# Patient Record
Sex: Male | Born: 1965 | Race: White | Hispanic: No | Marital: Married | Smoking: Never smoker
Health system: Southern US, Community
[De-identification: ages and names within clinical notes are randomized; demographics above are authoritative.]

## PROBLEM LIST (undated history)

## (undated) DIAGNOSIS — I493 Ventricular premature depolarization: Secondary | ICD-10-CM

## (undated) DIAGNOSIS — E7800 Pure hypercholesterolemia, unspecified: Secondary | ICD-10-CM

## (undated) DIAGNOSIS — J45909 Unspecified asthma, uncomplicated: Secondary | ICD-10-CM

---

## 2014-03-08 ENCOUNTER — Ambulatory Visit: Payer: Self-pay

## 2014-06-05 DIAGNOSIS — M6789 Other specified disorders of synovium and tendon, multiple sites: Secondary | ICD-10-CM | POA: Insufficient documentation

## 2014-06-05 DIAGNOSIS — M20012 Mallet finger of left finger(s): Secondary | ICD-10-CM | POA: Insufficient documentation

## 2016-04-14 ENCOUNTER — Observation Stay
Admission: EM | Admit: 2016-04-14 | Discharge: 2016-04-16 | Disposition: A | Discharge status: Home / Self Care | Payer: TRICARE For Life (TFL) | Attending: Surgery | Admitting: Surgery

## 2016-04-14 ENCOUNTER — Encounter
Admission: EM | Disposition: A | Discharge status: Home / Self Care | Payer: Self-pay | Source: Home / Self Care | Attending: Emergency Medicine

## 2016-04-14 ENCOUNTER — Observation Stay: Payer: TRICARE For Life (TFL) | Admitting: Anesthesiology

## 2016-04-14 ENCOUNTER — Ambulatory Visit (INDEPENDENT_AMBULATORY_CARE_PROVIDER_SITE_OTHER)
Admission: EM | Admit: 2016-04-14 | Discharge: 2016-04-14 | Disposition: A | Discharge status: Hospice | Payer: TRICARE For Life (TFL) | Source: Home / Self Care | Attending: Family Medicine | Admitting: Family Medicine

## 2016-04-14 ENCOUNTER — Encounter: Payer: Self-pay | Admitting: *Deleted

## 2016-04-14 ENCOUNTER — Emergency Department: Payer: TRICARE For Life (TFL)

## 2016-04-14 DIAGNOSIS — Z79899 Other long term (current) drug therapy: Secondary | ICD-10-CM | POA: Insufficient documentation

## 2016-04-14 DIAGNOSIS — R1084 Generalized abdominal pain: Secondary | ICD-10-CM | POA: Diagnosis not present

## 2016-04-14 DIAGNOSIS — Z7951 Long term (current) use of inhaled steroids: Secondary | ICD-10-CM | POA: Insufficient documentation

## 2016-04-14 DIAGNOSIS — K352 Acute appendicitis with generalized peritonitis, without abscess: Secondary | ICD-10-CM

## 2016-04-14 DIAGNOSIS — E039 Hypothyroidism, unspecified: Secondary | ICD-10-CM | POA: Diagnosis not present

## 2016-04-14 DIAGNOSIS — I4891 Unspecified atrial fibrillation: Secondary | ICD-10-CM | POA: Insufficient documentation

## 2016-04-14 DIAGNOSIS — E7800 Pure hypercholesterolemia, unspecified: Secondary | ICD-10-CM | POA: Diagnosis not present

## 2016-04-14 DIAGNOSIS — K5900 Constipation, unspecified: Secondary | ICD-10-CM

## 2016-04-14 DIAGNOSIS — J45909 Unspecified asthma, uncomplicated: Secondary | ICD-10-CM | POA: Insufficient documentation

## 2016-04-14 DIAGNOSIS — K219 Gastro-esophageal reflux disease without esophagitis: Secondary | ICD-10-CM | POA: Insufficient documentation

## 2016-04-14 DIAGNOSIS — Z7982 Long term (current) use of aspirin: Secondary | ICD-10-CM | POA: Diagnosis not present

## 2016-04-14 DIAGNOSIS — K3580 Unspecified acute appendicitis: Secondary | ICD-10-CM | POA: Diagnosis present

## 2016-04-14 HISTORY — DX: Ventricular premature depolarization: I49.3

## 2016-04-14 HISTORY — DX: Unspecified asthma, uncomplicated: J45.909

## 2016-04-14 HISTORY — PX: LAPAROSCOPIC APPENDECTOMY: SHX408

## 2016-04-14 HISTORY — DX: Pure hypercholesterolemia, unspecified: E78.00

## 2016-04-14 LAB — COMPREHENSIVE METABOLIC PANEL
ALT: 32 U/L (ref 17–63)
ANION GAP: 7 (ref 5–15)
AST: 26 U/L (ref 15–41)
Albumin: 3.9 g/dL (ref 3.5–5.0)
Alkaline Phosphatase: 67 U/L (ref 38–126)
BUN: 12 mg/dL (ref 6–20)
CALCIUM: 8.5 mg/dL — AB (ref 8.9–10.3)
CHLORIDE: 103 mmol/L (ref 101–111)
CO2: 24 mmol/L (ref 22–32)
CREATININE: 0.92 mg/dL (ref 0.61–1.24)
Glucose, Bld: 118 mg/dL — ABNORMAL HIGH (ref 65–99)
Potassium: 3.2 mmol/L — ABNORMAL LOW (ref 3.5–5.1)
SODIUM: 134 mmol/L — AB (ref 135–145)
Total Bilirubin: 0.8 mg/dL (ref 0.3–1.2)
Total Protein: 7 g/dL (ref 6.5–8.1)

## 2016-04-14 LAB — LIPASE, BLOOD: LIPASE: 22 U/L (ref 11–51)

## 2016-04-14 LAB — CBC
HCT: 43.7 % (ref 40.0–52.0)
Hemoglobin: 15 g/dL (ref 13.0–18.0)
MCH: 30.3 pg (ref 26.0–34.0)
MCHC: 34.3 g/dL (ref 32.0–36.0)
MCV: 88.4 fL (ref 80.0–100.0)
PLATELETS: 236 10*3/uL (ref 150–440)
RBC: 4.94 MIL/uL (ref 4.40–5.90)
RDW: 13.1 % (ref 11.5–14.5)
WBC: 16.5 10*3/uL — AB (ref 3.8–10.6)

## 2016-04-14 SURGERY — APPENDECTOMY, LAPAROSCOPIC
Anesthesia: General | Site: Abdomen | Wound class: Clean

## 2016-04-14 MED ORDER — LEVOTHYROXINE SODIUM 50 MCG PO TABS
175.0000 ug | ORAL_TABLET | Freq: Every day | ORAL | Status: DC
  Administered 2016-04-15 – 2016-04-16 (×2): 175 ug via ORAL
  Filled 2016-04-14 (×2): qty 1

## 2016-04-14 MED ORDER — PANTOPRAZOLE SODIUM 40 MG PO TBEC
40.0000 mg | DELAYED_RELEASE_TABLET | Freq: Every day | ORAL | Status: DC
  Administered 2016-04-15 – 2016-04-16 (×2): 40 mg via ORAL
  Filled 2016-04-14 (×2): qty 1

## 2016-04-14 MED ORDER — IOPAMIDOL (ISOVUE-300) INJECTION 61%
15.0000 mL | Freq: Once | INTRAVENOUS | Status: AC
  Administered 2016-04-14: 15 mL via ORAL

## 2016-04-14 MED ORDER — ROCURONIUM BROMIDE 100 MG/10ML IV SOLN
INTRAVENOUS | Status: DC | PRN
  Administered 2016-04-14: 30 mg via INTRAVENOUS

## 2016-04-14 MED ORDER — MORPHINE SULFATE (PF) 2 MG/ML IV SOLN
6.0000 mg | Freq: Once | INTRAVENOUS | Status: AC
  Administered 2016-04-14: 4 mg via INTRAVENOUS
  Filled 2016-04-14: qty 3

## 2016-04-14 MED ORDER — ONDANSETRON HCL 4 MG/2ML IJ SOLN: 4.0000 mg | Freq: Four times a day (QID) | INTRAMUSCULAR | Status: DC | PRN

## 2016-04-14 MED ORDER — ACETAMINOPHEN 650 MG RE SUPP: 650.0000 mg | Freq: Four times a day (QID) | RECTAL | Status: DC | PRN

## 2016-04-14 MED ORDER — FENTANYL CITRATE (PF) 100 MCG/2ML IJ SOLN
INTRAMUSCULAR | Status: DC
Start: 2016-04-14 — End: 2016-04-14
  Filled 2016-04-14: qty 2

## 2016-04-14 MED ORDER — PIPERACILLIN-TAZOBACTAM 3.375 G IVPB
3.3750 g | Freq: Three times a day (TID) | INTRAVENOUS | Status: DC
  Administered 2016-04-14 – 2016-04-16 (×6): 3.375 g via INTRAVENOUS
  Filled 2016-04-14 (×6): qty 50

## 2016-04-14 MED ORDER — ENOXAPARIN SODIUM 40 MG/0.4ML ~~LOC~~ SOLN
40.0000 mg | SUBCUTANEOUS | Status: DC
  Administered 2016-04-15: 40 mg via SUBCUTANEOUS
  Filled 2016-04-14: qty 0.4

## 2016-04-14 MED ORDER — PIPERACILLIN-TAZOBACTAM 3.375 G IVPB
3.3750 g | Freq: Once | INTRAVENOUS | Status: AC
Start: 2016-04-14 — End: 2016-04-14
  Administered 2016-04-14: 3.375 g via INTRAVENOUS
  Filled 2016-04-14: qty 50

## 2016-04-14 MED ORDER — KCL IN DEXTROSE-NACL 20-5-0.45 MEQ/L-%-% IV SOLN
INTRAVENOUS | Status: DC
  Administered 2016-04-14 – 2016-04-15 (×2): via INTRAVENOUS
  Administered 2016-04-15: 75 mL/h via INTRAVENOUS
  Administered 2016-04-16: 12:00:00 via INTRAVENOUS
  Filled 2016-04-14 (×6): qty 1000

## 2016-04-14 MED ORDER — FENTANYL CITRATE (PF) 100 MCG/2ML IJ SOLN
INTRAMUSCULAR | Status: DC | PRN
Start: 2016-04-14 — End: 2016-04-14
  Administered 2016-04-14 (×2): 100 ug via INTRAVENOUS

## 2016-04-14 MED ORDER — BUPIVACAINE HCL 0.25 % IJ SOLN
INTRAMUSCULAR | Status: DC | PRN
  Administered 2016-04-14: 30 mL

## 2016-04-14 MED ORDER — PROPOFOL 10 MG/ML IV BOLUS
INTRAVENOUS | Status: DC | PRN
  Administered 2016-04-14: 200 mg via INTRAVENOUS

## 2016-04-14 MED ORDER — SODIUM CHLORIDE 0.9 % IR SOLN
Status: DC | PRN
  Administered 2016-04-14: 1000 mL

## 2016-04-14 MED ORDER — ONDANSETRON HCL 4 MG/2ML IJ SOLN
8.0000 mg | Freq: Once | INTRAMUSCULAR | Status: AC
  Administered 2016-04-14: 8 mg via INTRAVENOUS

## 2016-04-14 MED ORDER — DEXMEDETOMIDINE HCL IN NACL 400 MCG/100ML IV SOLN: INTRAVENOUS | Status: DC | PRN

## 2016-04-14 MED ORDER — DEXMEDETOMIDINE HCL 200 MCG/2ML IV SOLN
INTRAVENOUS | Status: DC | PRN
  Administered 2016-04-14: 8 ug via INTRAVENOUS

## 2016-04-14 MED ORDER — TRAZODONE HCL 100 MG PO TABS
100.0000 mg | ORAL_TABLET | Freq: Every day | ORAL | Status: DC
  Administered 2016-04-14 – 2016-04-15 (×2): 100 mg via ORAL
  Filled 2016-04-14 (×2): qty 1

## 2016-04-14 MED ORDER — ONDANSETRON HCL 4 MG/2ML IJ SOLN: 4.0000 mg | Freq: Once | INTRAMUSCULAR | Status: DC | PRN

## 2016-04-14 MED ORDER — ONDANSETRON HCL 4 MG/2ML IJ SOLN: 8.0000 mg | Freq: Once | INTRAMUSCULAR | Status: DC

## 2016-04-14 MED ORDER — ONDANSETRON HCL 4 MG/2ML IJ SOLN
INTRAMUSCULAR | Status: DC | PRN
Start: 2016-04-14 — End: 2016-04-14
  Administered 2016-04-14: 4 mg via INTRAVENOUS

## 2016-04-14 MED ORDER — SIMVASTATIN 20 MG PO TABS
20.0000 mg | ORAL_TABLET | Freq: Every day | ORAL | Status: DC
Start: 2016-04-14 — End: 2016-04-16
  Administered 2016-04-14 – 2016-04-16 (×3): 20 mg via ORAL
  Filled 2016-04-14 (×3): qty 1

## 2016-04-14 MED ORDER — MORPHINE SULFATE (PF) 2 MG/ML IV SOLN
INTRAVENOUS | Status: AC
  Filled 2016-04-14: qty 2

## 2016-04-14 MED ORDER — SODIUM CHLORIDE 0.9 % IV BOLUS (SEPSIS)
1000.0000 mL | Freq: Once | INTRAVENOUS | Status: AC
  Administered 2016-04-14: 1000 mL via INTRAVENOUS

## 2016-04-14 MED ORDER — PIPERACILLIN-TAZOBACTAM 3.375 G IVPB 30 MIN: 3.3750 g | Freq: Three times a day (TID) | INTRAVENOUS | Status: DC

## 2016-04-14 MED ORDER — ACETAMINOPHEN 325 MG PO TABS: 650.0000 mg | ORAL_TABLET | Freq: Four times a day (QID) | ORAL | Status: DC | PRN

## 2016-04-14 MED ORDER — HEPARIN SODIUM (PORCINE) 5000 UNIT/ML IJ SOLN
INTRAMUSCULAR | Status: DC | PRN
  Administered 2016-04-14: 5000 [IU]

## 2016-04-14 MED ORDER — ONDANSETRON 4 MG PO TBDP: 4.0000 mg | ORAL_TABLET | Freq: Four times a day (QID) | ORAL | Status: DC | PRN

## 2016-04-14 MED ORDER — SUCCINYLCHOLINE CHLORIDE 20 MG/ML IJ SOLN
INTRAMUSCULAR | Status: DC | PRN
  Administered 2016-04-14: 100 mg via INTRAVENOUS

## 2016-04-14 MED ORDER — MORPHINE SULFATE (PF) 2 MG/ML IV SOLN
2.0000 mg | Freq: Once | INTRAVENOUS | Status: AC
  Administered 2016-04-14: 2 mg via INTRAVENOUS
  Filled 2016-04-14: qty 1

## 2016-04-14 MED ORDER — MIDAZOLAM HCL 2 MG/2ML IJ SOLN
INTRAMUSCULAR | Status: DC | PRN
  Administered 2016-04-14: 2 mg via INTRAVENOUS

## 2016-04-14 MED ORDER — LACTATED RINGERS IV SOLN
INTRAVENOUS | Status: DC | PRN
  Administered 2016-04-14: 18:00:00 via INTRAVENOUS

## 2016-04-14 MED ORDER — PANTOPRAZOLE SODIUM 20 MG PO TBEC: 20.0000 mg | DELAYED_RELEASE_TABLET | Freq: Every day | ORAL | Status: DC

## 2016-04-14 MED ORDER — ASPIRIN 81 MG PO CHEW
81.0000 mg | CHEWABLE_TABLET | Freq: Every day | ORAL | Status: DC
  Administered 2016-04-14 – 2016-04-16 (×3): 81 mg via ORAL
  Filled 2016-04-14 (×3): qty 1

## 2016-04-14 MED ORDER — FENTANYL CITRATE (PF) 100 MCG/2ML IJ SOLN: 25.0000 ug | INTRAMUSCULAR | Status: DC | PRN

## 2016-04-14 MED ORDER — PIPERACILLIN-TAZOBACTAM 3.375 G IVPB: 3.3750 g | Freq: Three times a day (TID) | INTRAVENOUS | Status: DC

## 2016-04-14 MED ORDER — SUGAMMADEX SODIUM 200 MG/2ML IV SOLN
INTRAVENOUS | Status: DC | PRN
  Administered 2016-04-14: 200 mg via INTRAVENOUS

## 2016-04-14 MED ORDER — HYDROCODONE-ACETAMINOPHEN 5-325 MG PO TABS
1.0000 | ORAL_TABLET | ORAL | Status: DC | PRN
  Administered 2016-04-15 – 2016-04-16 (×4): 2 via ORAL
  Filled 2016-04-14 (×4): qty 2

## 2016-04-14 MED ORDER — IOPAMIDOL (ISOVUE-300) INJECTION 61%
100.0000 mL | Freq: Once | INTRAVENOUS | Status: AC | PRN
  Administered 2016-04-14: 100 mL via INTRAVENOUS

## 2016-04-14 MED ORDER — ONDANSETRON HCL 4 MG/2ML IJ SOLN
4.0000 mg | Freq: Once | INTRAMUSCULAR | Status: AC
  Administered 2016-04-14: 4 mg via INTRAVENOUS
  Filled 2016-04-14: qty 2

## 2016-04-14 MED ORDER — MORPHINE SULFATE (PF) 4 MG/ML IV SOLN
4.0000 mg | INTRAVENOUS | Status: DC | PRN
  Administered 2016-04-14: 4 mg via INTRAVENOUS

## 2016-04-14 SURGICAL SUPPLY — 34 items
CANISTER SUCT 3000ML (MISCELLANEOUS) ×3 IMPLANT
CHLORAPREP W/TINT 26ML (MISCELLANEOUS) ×3 IMPLANT
CUTTER FLEX LINEAR 45M (STAPLE) ×3 IMPLANT
DRSG TEGADERM 2-3/8X2-3/4 SM (GAUZE/BANDAGES/DRESSINGS) ×9 IMPLANT
DRSG TELFA 3X8 NADH (GAUZE/BANDAGES/DRESSINGS) ×3 IMPLANT
ELECT REM PT RETURN 9FT ADLT (ELECTROSURGICAL) ×3
ELECTRODE REM PT RTRN 9FT ADLT (ELECTROSURGICAL) ×1 IMPLANT
GLOVE BIO SURGEON STRL SZ7.5 (GLOVE) ×3 IMPLANT
GLOVE INDICATOR 8.0 STRL GRN (GLOVE) ×3 IMPLANT
GOWN STRL REUS W/ TWL LRG LVL3 (GOWN DISPOSABLE) ×2 IMPLANT
GOWN STRL REUS W/TWL LRG LVL3 (GOWN DISPOSABLE) ×4
GRASPER SUT TROCAR 14GX15 (MISCELLANEOUS) ×3 IMPLANT
IRRIGATION STRYKERFLOW (MISCELLANEOUS) ×1 IMPLANT
IRRIGATOR STRYKERFLOW (MISCELLANEOUS) ×3
IV NS 1000ML (IV SOLUTION) ×4
IV NS 1000ML BAXH (IV SOLUTION) ×2 IMPLANT
KIT RM TURNOVER STRD PROC AR (KITS) ×3 IMPLANT
NEEDLE FILTER BLUNT 18X 1/2SAF (NEEDLE) ×2
NEEDLE FILTER BLUNT 18X1 1/2 (NEEDLE) ×1 IMPLANT
NEEDLE HYPO 25X1 1.5 SAFETY (NEEDLE) ×3 IMPLANT
NEEDLE INSUFFLATION 14GA 120MM (NEEDLE) ×3 IMPLANT
NS IRRIG 500ML POUR BTL (IV SOLUTION) ×3 IMPLANT
PACK LAP CHOLECYSTECTOMY (MISCELLANEOUS) ×3 IMPLANT
POUCH ENDO CATCH 10MM SPEC (MISCELLANEOUS) ×3 IMPLANT
RELOAD 45 VASCULAR/THIN (ENDOMECHANICALS) ×6 IMPLANT
RELOAD STAPLE TA45 3.5 REG BLU (ENDOMECHANICALS) ×6 IMPLANT
SCISSORS METZENBAUM CVD 33 (INSTRUMENTS) IMPLANT
SUT ETHILON 5-0 FS-2 18 BLK (SUTURE) ×6 IMPLANT
SUT VIC AB 0 CT2 27 (SUTURE) ×3 IMPLANT
SYRINGE 10CC LL (SYRINGE) ×3 IMPLANT
TROCAR XCEL 12X100 BLDLESS (ENDOMECHANICALS) ×6 IMPLANT
TROCAR Z-THREAD FIOS 11X100 BL (TROCAR) ×3 IMPLANT
TROCAR Z-THREAD SLEEVE 11X100 (TROCAR) ×3 IMPLANT
TUBING INSUFFLATOR HI FLOW (MISCELLANEOUS) ×3 IMPLANT

## 2016-04-14 NOTE — ED Triage Notes (Signed)
Patient complains of generalized weakness and abdominal pain that started 2 days ago with sudden worsening today. Patient states that he had a near syncopal episode in our lobby today. Patient states that pain has suddenly gotten very severe and stretches across entire abdomen.

## 2016-04-14 NOTE — Anesthesia Preprocedure Evaluation (Signed)
Anesthesia Evaluation  Patient identified by MRN, date of birth, ID band Patient awake    Reviewed: Allergy & Precautions, NPO status , Patient's Chart, lab work & pertinent test results  Airway Mallampati: II  TM Distance: >3 FB     Dental  (+) Caps, Chipped   Pulmonary asthma ,    Pulmonary exam normal        Cardiovascular Normal cardiovascular exam+ dysrhythmias      Neuro/Psych negative neurological ROS  negative psych ROS   GI/Hepatic Neg liver ROS, GERD  Medicated,Acute appendix   Endo/Other  negative endocrine ROS  Renal/GU negative Renal ROS  negative genitourinary   Musculoskeletal negative musculoskeletal ROS (+)   Abdominal Normal abdominal exam  (+)   Peds negative pediatric ROS (+)  Hematology negative hematology ROS (+)   Anesthesia Other Findings Past Medical History: No date: Asthma No date: High cholesterol No date: PVC (premature ventricular contraction)  Reproductive/Obstetrics                             Anesthesia Physical Anesthesia Plan  ASA: II and emergent  Anesthesia Plan: General   Post-op Pain Management:    Induction: Intravenous, Rapid sequence and Cricoid pressure planned  Airway Management Planned: Oral ETT  Additional Equipment:   Intra-op Plan:   Post-operative Plan: Extubation in OR  Informed Consent: I have reviewed the patients History and Physical, chart, labs and discussed the procedure including the risks, benefits and alternatives for the proposed anesthesia with the patient or authorized representative who has indicated his/her understanding and acceptance.   Dental advisory given  Plan Discussed with: CRNA and Surgeon  Anesthesia Plan Comments:         Anesthesia Quick Evaluation

## 2016-04-14 NOTE — Anesthesia Procedure Notes (Signed)
Procedure Name: Intubation Date/Time: 04/14/2016 6:30 PM Performed by: ZOXWRUEKILDUFF, Eddye Broxterman Pre-anesthesia Checklist: Patient identified, Emergency Drugs available, Suction available, Timeout performed and Patient being monitored Patient Re-evaluated:Patient Re-evaluated prior to inductionOxygen Delivery Method: Circle system utilized Preoxygenation: Pre-oxygenation with 100% oxygen Intubation Type: IV induction and Rapid sequence Ventilation: Mask ventilation throughout procedure Laryngoscope Size: Mac and 3 Grade View: Grade II Tube type: Oral Tube size: 7.5 mm Number of attempts: 1 Airway Equipment and Method: Stylet Placement Confirmation: ETT inserted through vocal cords under direct vision,  positive ETCO2,  CO2 detector and breath sounds checked- equal and bilateral Secured at: 23 cm Tube secured with: Tape

## 2016-04-14 NOTE — ED Notes (Signed)
Dr. Ely at bedside.

## 2016-04-14 NOTE — H&P (Signed)
Florian BuffStephen Wade Lowenstein is a 50 y.o. male  with several days of abdominal pain.  HPI: He was in his usual state of good health until last week when he developed some generalized abdominal discomfort and mild anorexia. He has some nausea and vomiting over the past week and then yesterday had an increase in his abdominal pain. The pain was primarily in the periumbilical area and right lower quadrant. He remains anorexic. His last meal was last evening. Fever or chills. He denies any other similar GI problems. He does have a history of peptic ulcer disease it is quite soft. Otherwise he has no history of hepatitis yellow jaundice pancreatitis gallbladder disease or diverticulitis. He's had no previous abdominal surgery.  He did undergo a cardiac catheterization several years ago for syncopal spell. He has had no cardiac symptoms recently. He does have history of hypercholesterolemia and hypothyroidism. In the emergency room workup demonstrated an elevated white blood cell count and CT scan demonstrated a probable acute appendicitis with some periappendiceal fluid consistent with possible ruptured appendicitis. The surgical service was consulted.  Past Medical History:  Diagnosis Date  . Asthma   . High cholesterol   . PVC (premature ventricular contraction)    History reviewed. No pertinent surgical history. Social History   Social History  . Marital status: Married    Spouse name: N/A  . Number of children: N/A  . Years of education: N/A   Social History Main Topics  . Smoking status: Never Smoker  . Smokeless tobacco: Former NeurosurgeonUser  . Alcohol use No  . Drug use: Unknown  . Sexual activity: Not Asked   Other Topics Concern  . None   Social History Narrative  . None     Review of Systems  Constitutional: Negative for chills and fever.  HENT: Negative.   Eyes: Negative.   Respiratory: Negative for cough, hemoptysis, shortness of breath and wheezing.   Cardiovascular: Negative for  chest pain and palpitations.  Gastrointestinal: Positive for abdominal pain, nausea and vomiting. Negative for constipation, diarrhea and heartburn.  Genitourinary: Negative.   Musculoskeletal: Negative.   Skin: Negative.   Neurological: Negative.   Psychiatric/Behavioral: Negative.      PHYSICAL EXAM: BP 139/89 (BP Location: Right Arm)   Pulse 83   Temp 98.7 F (37.1 C) (Oral)   Resp 18   Ht 6\' 1"  (1.854 m)   Wt 95.3 kg (210 lb)   SpO2 99%   BMI 27.71 kg/m   Physical Exam  Constitutional: He is oriented to person, place, and time. He appears well-developed and well-nourished. No distress.  HENT:  Head: Normocephalic and atraumatic.  Eyes: EOM are normal. Pupils are equal, round, and reactive to light.  Neck: Normal range of motion. Neck supple.  Cardiovascular: Normal rate and regular rhythm.   Pulmonary/Chest: Effort normal and breath sounds normal. No respiratory distress.  Abdominal: Soft. Bowel sounds are normal. He exhibits distension. There is tenderness. There is rebound and guarding.  Musculoskeletal: Normal range of motion. He exhibits no edema or deformity.  Neurological: He is alert and oriented to person, place, and time.  Skin: He is not diaphoretic.  Psychiatric: He has a normal mood and affect. His behavior is normal.   His abdomen is markedly tender right lower quadrant with some guarding and rebound. He does have active bowel sounds.  Impression/Plan: I have independently reviewed his CT scan. He does appear to have a markedly distended appendix with periappendiceal fluid consistent with possible acute appendicitis.  And concern he may have an early rupture or certainly some evidence of necrosis. I discussed surgical intervention with the patient and his wife and we will plan to proceed as soon as an operating room is available. Patient is in agreement.   Tiney Rougealph Ely III, MD  04/14/2016, 2:38 PM

## 2016-04-14 NOTE — ED Triage Notes (Signed)
[facility], states abd pain for 2 days, states vomiting that began this AM, states diarrhea 2 days ago, [facility] had a near syncopal episode in the lobby

## 2016-04-14 NOTE — ED Provider Notes (Signed)
[facility]    CSN: [id number] Arrival date & time: 04/14/16  1042     History   Chief Complaint Chief Complaint  Patient presents with  . Abdominal Pain    HPI Eugene Villegas is a 50 y.o. male.   Patient's here because of abdominal pain. States abdominal pain started about for 5 days ago is progressively gotten worse. He stopped having bowel movements about 2 days ago. The dominant pain became so bad today that he actually passed out as he came into the facility. Patient is unable to give much history with during and pain on the gurney and states that he hurts. [facility]. He has had multiple scars on his arms and multiple injuries. He reports having problem with his cholesterol anxiety. He is in the nurse name and number that they call to get history is he is not able to give much history by his past. No known history of arrhythmias. Unable to ascertain whether he smokes and unable to get much accurate history from the patient.   The history is provided by the patient. No language interpreter was used.  Abdominal Pain  Pain location:  Generalized Pain quality: cramping   Pain severity:  Moderate Onset quality:  Sudden Timing:  Constant Progression:  Worsening Chronicity:  New Relieved by:  Nothing Worsened by:  Nothing Ineffective treatments:  Not moving Associated symptoms: constipation, nausea and vomiting     No past medical history on file.  There are no active problems to display for this patient.   No past surgical history on file.     Home Medications    Prior to Admission medications   Not on File    Family History No family history on file.  Social History Social History  Substance Use Topics  . Smoking status: Not on file  . Smokeless tobacco: Not on file  . Alcohol use Not on file     Allergies   Review of patient's allergies indicates not on file.   Review of Systems Review of  Systems  Unable to perform ROS: Mental status change  Gastrointestinal: Positive for abdominal pain, constipation, nausea and vomiting.  Skin: Negative for color change.  Neurological: Positive for syncope.  Psychiatric/Behavioral: The patient is nervous/anxious.   All other systems reviewed and are negative.    Physical Exam Triage Vital Signs ED Triage Vitals  Enc Vitals Group     BP 04/14/16 1044 (!) 116/59     Pulse Rate 04/14/16 1044 77     Resp 04/14/16 1044 16     Temp 04/14/16 1044 97.7 F (36.5 C)     Temp Source 04/14/16 1044 Oral     SpO2 04/14/16 1044 94 %     Weight 04/14/16 1044 210 lb (95.3 kg)     Height 04/14/16 1044 6\' 1"  (1.854 m)     Head Circumference --      Peak Flow --      Pain Score 04/14/16 1047 10     Pain Loc --      Pain Edu? --      Excl. in GC? --    No data found.   Updated Vital Signs BP (!) 116/59 (BP Location: Left Arm)   Pulse 77   Temp 97.7 F (36.5 C) (Oral)   Resp 16   Ht 6\' 1"  (1.854 m)   Wt 210 lb (95.3 kg)   SpO2 94%  BMI 27.71 kg/m   Visual Acuity Right Eye Distance:   Left Eye Distance:   Bilateral Distance:    Right Eye Near:   Left Eye Near:    Bilateral Near:     Physical Exam  Constitutional: He is oriented to person, place, and time. He appears well-developed and well-nourished.  HENT:  Head: Normocephalic and atraumatic.  Eyes: Pupils are equal, round, and reactive to light.  Neck: Normal range of motion. Neck supple.  Cardiovascular: Normal rate and regular rhythm.   Pulmonary/Chest: Effort normal and breath sounds normal. No respiratory distress. He exhibits no tenderness.  Abdominal: Normal appearance. He exhibits no distension. Bowel sounds are decreased. There is no hepatosplenomegaly. There is generalized tenderness. There is no CVA tenderness. No hernia.  Musculoskeletal: Normal range of motion.  Neurological: He is alert and oriented to person, place, and time. He has normal reflexes.  Skin:  Skin is warm and dry.  Psychiatric: He has a normal mood and affect.  Vitals reviewed.    UC Treatments / Results  Labs (all labs ordered are listed, but only abnormal results are displayed) Labs Reviewed - No data to display  EKG  EKG Interpretation None     EKG showed sinus rhythm with frequent PVCs and there was also question of old septal infarct on EKG. ED ECG REPORT I, Deonta Bomberger H, the attending physician, personally viewed and interpreted this ECG.   Date: 04/14/2016  EKG Time:10:51:31  Rate:83  Rhythm: there are no previous tracings available for comparison, normal sinus rhythm, occasional PVC noted, unifocal  Axis: 26  Intervals:none  ST&T Change: possible septal infarct Radiology No results found.  Procedures Procedures (including critical care time)  Medications Ordered in UC Medications  ondansetron (ZOFRAN) injection 8 mg (8 mg Intravenous Given 04/14/16 1105)     Initial Impression / Assessment and Plan / UC Course  I have reviewed the triage vital signs and the nursing notes.  Pertinent labs & imaging results that were available during my care of the patient were reviewed by me and considered in my medical decision making (see chart for details).  Clinical Course    Should be noted that along with the syncopal/near-syncopal episode he had here he's also had PVCs on the monitor. He was hooked up to 02 which seemed to diminish the PVCs. On EKG he only had a few PVCs noted. He also had what looked like an old septal infarct. Since bowel sounds were still diminished and he was not having a bowel movement for 2 days and having nausea and vomiting now this concerned that he may have a bowel obstruction. IV fluids were started and IV Zofran was given. [facility] and [facility].  Final Clinical Impressions(s) / UC Diagnoses   Final diagnoses:  Generalized abdominal pain  Constipation, unspecified  constipation type    New Prescriptions There are no discharge medications for this patient.    No results found for this or any previous visit.    Note: This dictation was prepared with Dragon dictation along with smaller phrase technology. Any transcriptional errors that result from this process are unintentional.   Eugene RowanEugene Garlen Reinig, MD 04/14/16 [phone]

## 2016-04-14 NOTE — Op Note (Signed)
04/14/2016  7:36 PM  PATIENT:  Florian BuffStephen Wade Dlugosz  50 y.o. male  PRE-OPERATIVE DIAGNOSIS:  appendicitis  POST-OPERATIVE DIAGNOSIS:  appendicitis  PROCEDURE:  Procedure(s): APPENDECTOMY LAPAROSCOPIC (N/A)  SURGEON:  Surgeon(s) and Role:    * Tiney Rougealph Ely III, MD - Primary   ASSISTANTS: none   ANESTHESIA:   general  EBL:  Total I/O In: 800 [I.V.:800] Out: 10 [Blood:10]   DRAINS: none   LOCAL MEDICATIONS USED:  MARCAINE      DISPOSITION OF SPECIMEN:  PATHOLOGY   DICTATION: .Dragon Dictation with the patient in the supine position and after induction of appropriate general anesthesia the patient's abdomen was prepped with ChloraPrep and draped sterile towels. Patient was placed in the headdown feet up position and a small infraumbilical incision was made in the standard fashion. A varies needle was used cannulate peritoneal cavity. CO2 was insufflated to appropriate pressure measurements. When approximately 2-1/2 L of CO2 were instilled a varies needle was withdrawn and an 11 mm port placed without difficulty. Intraperitoneal position was confirmed and CO2 was reinsufflated.  Right lower quadrant was examined. There was some free fluid noted. No obvious purulence was seen. There was some fibrinous exudate over the cecum. A left lower quadrant transverse incision was made 11 mm port inserted under direct vision and a suprapubic incision was made and a 12 mm port inserted under direct vision. The camera was moved to the upper port and dissection carried out through the 2 lower ports. Pinnix was easily visualized markedly thickened but its base appeared to be normal size. A window was created behind the base of the appendix and the appendix was stapled using the Endo GIA stapling device carrying a blue load. Mesoappendix was quite edematous and required 2 loads of the Endo GIA stapling device with a blue stapling load. The specimen was captured Endo Catch apparatus removed through the  umbilical incision. The area was copiously irrigated. The mesoappendix was inspected and no bleeding was encountered. The suprapubic and umbilical incisions were closed fascially with a 0 Vicryl in a figure-of-eight fashion using the suture passer. Abdomen was then desufflated and the skin incision closed with 5-0 nylon. Wounds were dressed sterilely and the patient returned recovery room having tolerated procedure well. 0.25% Marcaine was injected for postoperative pain control. Sponge instrument needle count were correct 2 in the operating room  PLAN OF CARE: Admit for overnight observation  PATIENT DISPOSITION:  PACU - hemodynamically stable.   Tiney Rougealph Ely III, MD

## 2016-04-14 NOTE — Progress Notes (Signed)
Pt is SR with occasional PVCs and occasional ventricular bigeminy with pulse 45.  Informed Dr. Noralyn Pickarroll.  Dr. Noralyn Pickarroll stated pt has hx of PVCs and is ok.  Pt is responsive to voice and AO.  Pt denies pain.  Will continue to monitor.

## 2016-04-14 NOTE — ED Provider Notes (Signed)
[facility] Emergency Department Provider Note  ____________________________________________   First MD Initiated Contact with Patient 04/14/16 1216     (approximate)  I have reviewed the triage vital signs and the nursing notes.  HISTORY  Chief Complaint Abdominal Pain and Near Syncope    HPI Eugene Villegas is a 50 y.o. male here for evaluation of abdominal pain  Patient reports he's been having severe and increasing abdominal pain around the middle of the abdomen that radiates toward his back for about the last 2 days. He has not had a bowel movement 2 days, but the movement he had 2 days ago seemed very normal. He has vomited twice today, and reports she is having severe pain to the point that pain made him feel very lightheaded as though he is given a pass out when he went to urgent care. He denies passing out, and present time reports a 10 out of 10 pain. Midabdominal radiating to the back  No fevers or chills. Denies any spider bites. No alcohol use   Past Medical History:  Diagnosis Date  . Asthma   . High cholesterol   . PVC (premature ventricular contraction)     Patient Active Problem List   Diagnosis Date Noted  . Acute appendicitis with generalized peritonitis     History reviewed. No pertinent surgical history.  Prior to Admission medications   Not on File    Allergies Fish allergy  History reviewed. No pertinent family history.  Social History Social History  Substance Use Topics  . Smoking status: Never Smoker  . Smokeless tobacco: Former NeurosurgeonUser  . Alcohol use No    Review of Systems Constitutional: No fever/chills Eyes: No visual changes. ENT: No sore throat. Cardiovascular: Denies chest pain. Respiratory: Denies shortness of breath. Gastrointestinal: No diarrhea No constipation. Genitourinary: Negative for dysuria. No pain in the groin or testicles. No problems with his penis Musculoskeletal: Negative for  back pain. Skin: Negative for rash. Neurological: Negative for headaches, focal weakness or numbness.  10-point ROS otherwise negative.  ____________________________________________   PHYSICAL EXAM:  VITAL SIGNS: ED Triage Vitals [04/14/16 1137]  Enc Vitals Group     BP 133/82     Pulse Rate 92     Resp 18     Temp 98.7 F (37.1 C)     Temp Source Oral     SpO2 96 %     Weight 210 lb (95.3 kg)     Height 6\' 1"  (1.854 m)     Head Circumference      Peak Flow      Pain Score 9     Pain Loc      Pain Edu?      Excl. in GC?     Constitutional: Alert and oriented. Appears in moderate pain, holding his hands over his mid abdomen Eyes: Conjunctivae are normal. PERRL. EOMI. Head: Atraumatic. Nose: No congestion/rhinnorhea. Mouth/Throat: Mucous membranes are moist.  Oropharynx non-erythematous. Neck: No stridor.   Cardiovascular: Normal rate, regular rhythm. Grossly normal heart sounds.  Good peripheral circulation. Respiratory: Normal respiratory effort.  No retractions. Lungs CTAB. Gastrointestinal: Rather rigid, extremely tender to palpation especially in the epigastrium and periumbilically. The patient has modest peritonitis on exam throughout to percussion. Does not appear distended. The umbilicus is not protuberant, no masses noted. No groin pain or masses are palpable.  Musculoskeletal: No lower extremity tenderness nor edema.  No joint effusions. Neurologic:  Normal speech and language. No gross  focal neurologic deficits are appreciated.  Skin:  Skin is warm, dry and intact. No rash noted. Psychiatric: Mood and affect are normal. Speech and behavior are normal.  ____________________________________________   LABS (all labs ordered are listed, but only abnormal results are displayed)  Labs Reviewed  COMPREHENSIVE METABOLIC PANEL - Abnormal; Notable for the following:       Result Value   Sodium 134 (*)    Potassium 3.2 (*)    Glucose, Bld 118 (*)    Calcium 8.5  (*)    All other components within normal limits  CBC - Abnormal; Notable for the following:    WBC 16.5 (*)    All other components within normal limits  LIPASE, BLOOD  URINALYSIS COMPLETEWITH MICROSCOPIC ([facility] ONLY)   ____________________________________________  EKG  Reviewed and interpreted by me at 1155 Heart rate 85 QRS 90 QTc 460 Normal sinus rhythm, no evidence of ectopy or ischemia ____________________________________________  RADIOLOGY  1240PM: Based on the patient's abdominal examination and concern for peritonitis, requested CT perform a brief oral contrast administration prior to CT but did not wish to delay CT for more than 20-30 minutes as I'm concerned about the possibility of peritonitis, or possible acute illness such as perforation given abdominal exam.  Ct Abdomen Pelvis W Contrast  Result Date: 04/14/2016 CLINICAL DATA:  Weakness, abdominal pain EXAM: CT ABDOMEN AND PELVIS WITH CONTRAST TECHNIQUE: Multidetector CT imaging of the abdomen and pelvis was performed using the standard protocol following bolus administration of intravenous contrast. CONTRAST:  100 mL Isovue 300 IV COMPARISON:  None. FINDINGS: Lower chest: Lung bases are essentially clear. Hepatobiliary: Liver is within normal limits. Gallbladder is unremarkable. No intrahepatic or extrahepatic ductal dilatation. Pancreas: Within normal limits. Spleen: Within normal limits. Adrenals/Urinary Tract: Adrenal glands are within normal limits. 2.7 cm right upper pole renal cyst (series 2/ image 36). 12 mm septated cyst in the interpolar right kidney (series 2/image 38), indeterminate. 7 mm cyst in the anterior left upper kidney (series 2/image 36. No hydronephrosis. Bladder is within normal limits. Stomach/Bowel: Stomach is within normal limits. No evidence of bowel obstruction. Dilatation of the proximal appendix measuring up to 16 mm (series 2/image 70). Mild periappendiceal inflammatory changes (series 2/ image  68). Associated proximal appendicolith (series 2/image 68). These findings are compatible with acute appendicitis. No drainable fluid collection/abscess.  No free air. Vascular/Lymphatic: No evidence of abdominal aortic aneurysm. No suspicious abdominopelvic lymphadenopathy. Reproductive: Prostate is unremarkable. Other: No abdominopelvic ascites. Small fat containing left inguinal hernia (series 2/image 97). Musculoskeletal: Mild degenerative changes of the visualized thoracolumbar spine. IMPRESSION: Acute appendicitis. No drainable fluid collection/ abscess.  No free air. Electronically Signed   By: Charline Bills M.D.   On: 04/14/2016 13:30    ____________________________________________   PROCEDURES  Procedure(s) performed: None  Procedures  Critical Care performed: No  ____________________________________________   INITIAL IMPRESSION / ASSESSMENT AND PLAN / ED COURSE  Pertinent labs & imaging results that were available during my care of the patient were reviewed by me and considered in my medical decision making (see chart for details).  Differential diagnosis includes but is not limited to, abdominal perforation, aortic dissection, cholecystitis, appendicitis, diverticulitis, colitis, esophagitis/gastritis, kidney stone, pyelonephritis, urinary tract infection, aortic aneurysm. All are considered in decision and treatment plan. Based upon the patient's presentation and risk factors, I'm concerned most about abdominal etiology leading to his pain today and near syncopal episode. We'll proceed with CT scan to further delineate.   Clinical Course   -----------------------------------------  1:43 PM on 04/14/2016 -----------------------------------------  Patient reports pain improved. CT scan appears consistent with acute appendicitis. Have paged and placed consult with Dr. Michela PitcherEly  Patient reports his pain is better, currently resting comfortably. Awaiting consult by general  surgery.  ____________________________________________   FINAL CLINICAL IMPRESSION(S) / ED DIAGNOSES  Final diagnoses:  Acute appendicitis with generalized peritonitis      NEW MEDICATIONS STARTED DURING THIS VISIT:  New Prescriptions   No medications on file     Note:  This document was prepared using Dragon voice recognition software and may include unintentional dictation errors.     Sharyn CreamerMark Manfred Laspina, MD 04/14/16 [phone]

## 2016-04-14 NOTE — ED Notes (Signed)
[facility] gave 8mg  IV zofran before arrival

## 2016-04-14 NOTE — Progress Notes (Signed)
Patient still in surgery at this time, left IS on bedside table to set up once patient arrives to room.

## 2016-04-14 NOTE — ED Notes (Signed)
Report given to brandy in the OR °

## 2016-04-14 NOTE — Anesthesia Postprocedure Evaluation (Signed)
Anesthesia Post Note  Patient: Eugene Villegas  Procedure(s) Performed: Procedure(s) (LRB): APPENDECTOMY LAPAROSCOPIC (N/A)  Patient location during evaluation: PACU Anesthesia Type: General Level of consciousness: awake and alert and oriented Pain management: pain level controlled Vital Signs Assessment: post-procedure vital signs reviewed and stable Respiratory status: spontaneous breathing Cardiovascular status: blood pressure returned to baseline Anesthetic complications: no    Last Vitals:  Vitals:   04/14/16 2120 04/14/16 2216  BP: (!) 109/52 140/73  Pulse: 84 97  Resp: 20 18  Temp: 36.8 C 37.1 C    Last Pain:  Vitals:   04/14/16 2216  TempSrc: Oral  PainSc:                  [name]

## 2016-04-14 NOTE — ED Notes (Signed)
Pt transported to [facility] ED via [facility]. 

## 2016-04-14 NOTE — Transfer of Care (Signed)
Immediate Anesthesia Transfer of Care Note  Patient: Eugene Villegas  Procedure(s) Performed: Procedure(s): APPENDECTOMY LAPAROSCOPIC (N/A)  Patient Location: PACU  Anesthesia Type:General  Level of Consciousness: awake and alert   Airway & Oxygen Therapy: Patient Spontanous Breathing  Post-op Assessment: Report given to RN  Post vital signs: Reviewed and stable  Last Vitals:  Vitals:   04/14/16 1600 04/14/16 1925  BP: 130/73 (!) 113/100  Pulse:  100  Resp: 18 20  Temp: 36.8 C 37 C    Last Pain:  Vitals:   04/14/16 1708  TempSrc:   PainSc: 8          Complications: No apparent anesthesia complications

## 2016-04-14 NOTE — ED Notes (Signed)
Pt returned from CT, assisted to bathroom by girlfriend

## 2016-04-14 NOTE — ED Notes (Signed)
Patient transported to CT 

## 2016-04-15 ENCOUNTER — Encounter: Payer: Self-pay | Admitting: Surgery

## 2016-04-15 MED ORDER — MORPHINE SULFATE 2 MG/ML IJ SOLN
4.0000 mg | INTRAMUSCULAR | Status: DC | PRN
  Administered 2016-04-15 (×3): 4 mg via INTRAVENOUS
  Filled 2016-04-15 (×3): qty 2

## 2016-04-15 NOTE — Progress Notes (Signed)
Subjective:   He has moderate abdominal pain with some mild nausea but his A. fib yesterday is much improved. Tolerating his pain medicine. He's not had any fever. He is able to take liquid diet. Overall is markedly improved.  Vital signs in last 24 hours: Temp:  [97 F (36.1 C)-99 F (37.2 C)] 98.3 F (36.8 C) (11/01 1223) Pulse Rate:  [39-105] 86 (11/01 1223) Resp:  [15-26] 18 (11/01 1223) BP: (109-143)/(52-101) 132/72 (11/01 1223) SpO2:  [93 %-99 %] 98 % (11/01 1223) Last BM Date: 04/12/16  Intake/Output from previous day: 10/31 0701 - 11/01 0700 In: 2042 [P.O.:480; I.V.:1537; IV Piggyback:25] Out: 635 [Urine:625; Blood:10]  Exam:  His abdomen is unremarkable with no significant drainage in his wounds look good.  Lab Results:  CBC  Recent Labs  04/14/16 1148  WBC 16.5*  HGB 15.0  HCT 43.7  PLT 236   CMP     Component Value Date/Time   NA 134 (L) 04/14/2016 1148   K 3.2 (L) 04/14/2016 1148   CL 103 04/14/2016 1148   CO2 24 04/14/2016 1148   GLUCOSE 118 (H) 04/14/2016 1148   BUN 12 04/14/2016 1148   CREATININE 0.92 04/14/2016 1148   CALCIUM 8.5 (L) 04/14/2016 1148   PROT 7.0 04/14/2016 1148   ALBUMIN 3.9 04/14/2016 1148   AST 26 04/14/2016 1148   ALT 32 04/14/2016 1148   ALKPHOS 67 04/14/2016 1148   BILITOT 0.8 04/14/2016 1148   GFRNONAA >60 04/14/2016 1148   GFRAA >60 04/14/2016 1148   PT/INR No results for input(s): LABPROT, INR in the last 72 hours.  Studies/Results: Ct Abdomen Pelvis W Contrast  Result Date: 04/14/2016 CLINICAL DATA:  Weakness, abdominal pain EXAM: CT ABDOMEN AND PELVIS WITH CONTRAST TECHNIQUE: Multidetector CT imaging of the abdomen and pelvis was performed using the standard protocol following bolus administration of intravenous contrast. CONTRAST:  100 mL Isovue 300 IV COMPARISON:  None. FINDINGS: Lower chest: Lung bases are essentially clear. Hepatobiliary: Liver is within normal limits. Gallbladder is unremarkable. No intrahepatic  or extrahepatic ductal dilatation. Pancreas: Within normal limits. Spleen: Within normal limits. Adrenals/Urinary Tract: Adrenal glands are within normal limits. 2.7 cm right upper pole renal cyst (series 2/ image 36). 12 mm septated cyst in the interpolar right kidney (series 2/image 38), indeterminate. 7 mm cyst in the anterior left upper kidney (series 2/image 36. No hydronephrosis. Bladder is within normal limits. Stomach/Bowel: Stomach is within normal limits. No evidence of bowel obstruction. Dilatation of the proximal appendix measuring up to 16 mm (series 2/image 70). Mild periappendiceal inflammatory changes (series 2/ image 68). Associated proximal appendicolith (series 2/image 68). These findings are compatible with acute appendicitis. No drainable fluid collection/abscess.  No free air. Vascular/Lymphatic: No evidence of abdominal aortic aneurysm. No suspicious abdominopelvic lymphadenopathy. Reproductive: Prostate is unremarkable. Other: No abdominopelvic ascites. Small fat containing left inguinal hernia (series 2/image 97). Musculoskeletal: Mild degenerative changes of the visualized thoracolumbar spine. IMPRESSION: Acute appendicitis. No drainable fluid collection/ abscess.  No free air. Electronically Signed   By: Charline BillsSriyesh  Krishnan M.D.   On: 04/14/2016 13:30    Assessment/Plan: He seems to be recovering nicely. We'll advance his diet and his activity level and hopefully plan discharge tomorrow.

## 2016-04-15 NOTE — Plan of Care (Signed)
Problem: Education: Goal: Knowledge of [facility] Education information/materials will improve Outcome: Progressing Oriented patient to room and 2c

## 2016-04-16 LAB — SURGICAL PATHOLOGY

## 2016-04-16 MED ORDER — HYDROCODONE-ACETAMINOPHEN 5-325 MG PO TABS: 1.0000 | ORAL_TABLET | ORAL | 0 refills | Status: AC | PRN

## 2016-04-16 NOTE — Discharge Instructions (Signed)
Laparoscopic Appendectomy, Adult °Appendectomy is surgery to remove the appendix. Laparoscopic surgery uses several small cuts (incisions) instead of one large incision. Laparoscopic surgery offers a shorter recovery time and less discomfort. °LET YOUR CAREGIVER KNOW ABOUT: °· Allergies to food or medicine. °· Medicines taken, including vitamins, dietary supplements, herbs, eyedrops, over-the-counter medicines, and creams. °· Use of steroids (by mouth or creams). °· Previous problems with anesthetics or numbing medicines. °· History of bleeding problems or blood clots. °· Previous surgery. °· Other health problems, including diabetes, heart problems, lung problems, and kidney problems. °· Possibility of pregnancy, if this applies. °RISKS AND COMPLICATIONS °· Infection. A germ starts growing in the wound. This can usually be treated with antibiotics. In some cases, the wound will need to be opened and cleaned. °· Bleeding. °· Damage to other organs. °· Sores (abscesses). °· Chronic pain at the incision sites. This is defined as pain that lasts for more than 3 months. °· Blood clots in the legs that may rarely travel to the lungs. °· Infection in the lungs (pneumonia). °BEFORE THE PROCEDURE °Appendectomy is usually performed immediately after an inflamed appendix (appendicitis) is diagnosed. No preparation is necessary ahead of this procedure. °PROCEDURE  °You will be given medicine that makes you sleep (general anesthetic). After you are asleep, a flexible tube (catheter) may be inserted into your bladder to drain your urine during surgery. The tube is removed before you wake up after surgery. When you are asleep, carbon dioxide gas will be used to inflate your abdomen. This will allow your surgeon to see inside your abdomen and perform your surgery. Three small incisions will be made in your abdomen. Your surgeon will insert a thin, lighted tube (laparoscope) through one of the incisions. Your surgeon will look  through the laparoscope while performing the surgery. Other tools will be inserted through the other incisions. Laparoscopic procedures may not be appropriate when: °· There is major scarring from a previous surgery. °· The patient has bleeding disorders. °· A pregnancy is near term. °· There are other conditions which make the laparoscopic procedure impossible, such as an advanced infection or a ruptured appendix. °If your surgeon feels it is not safe to continue with the laparoscopic procedure, he or she will perform an open surgery instead. This gives the surgeon a larger view and more space to work. Open surgery requires a longer recovery time. After your appendix is removed, your incisions will be closed with stitches (sutures) or skin adhesive. °AFTER THE PROCEDURE °You will be taken to a recovery room. When the anesthesia has worn off, you will be returned to your hospital room. You will be given pain medicines to keep you comfortable. Ask your caregiver how long your hospital stay will be. °  °This information is not intended to replace advice given to you by your health care provider. Make sure you discuss any questions you have with your health care provider. °  °Document Released: 01/14/2004 Document Revised: 06/22/2014 Document Reviewed: 11/19/2014 °Elsevier Interactive Patient Education ©2016 [employer] °Laparoscopic Appendectomy  °Care After  ° ° °These instructions give you information on caring for yourself after your procedure. Your doctor may also give you more specific instructions. Call your doctor if you have any problems or questions after your procedure.  °HOME CARE  °Do not drive while taking pain medicine (narcotics).  °Take medicine (stool softener) if you cannot poop (constipated).  °Change your bandages (dressings) as told by your doctor.  °Keep your wounds clean and dry.   Wash the wounds gently with soap and water. Gently pat the wounds dry with a clean towel.  Do not take baths,  swim, or use hot tubs for 10 days, or as told by your doctor.  Only take medicine as told by your doctor.  Continue your normal diet as told by your doctor.  Do not lift more than 10 pounds (4.5 kilograms) or play contact sports for 3 weeks, or as told by your doctor.  Slowly increase your activity.  Take deep breaths to avoid a lung infection (pneumonia). GET HELP RIGHT AWAY IF:  You have a fever >101 You have a rash.  You have trouble breathing or sharp chest pain.  You have a reaction to the medicine you are taking.  Your wound is red, puffy (swollen), or painful.  You have yellowish-white fluid (pus) coming from the wound.  You have fluid coming from the wound for longer than 1 day.  You notice a bad smell coming from the wound or bandage.  Your wound breaks open after stitches (sutures) or staples are removed.  You have pain in the shoulders or shoulder blades.   You are short of breath.  You feel sick to your stomach (nauseous) or throw up (vomit).  MAKE SURE YOU:  Understand these instructions.  Will watch your condition.  Will get help right away if you are not doing well or get worse.

## 2016-04-16 NOTE — Plan of Care (Signed)
Problem: Bowel/Gastric: Goal: Will not experience complications related to bowel motility Outcome: Progressing Education on the importance of ambulation was stressed to the patient, he agreed to walk around the unit later on in the day

## 2016-04-16 NOTE — Progress Notes (Signed)
Reviewed discharge instructions with patient, reviewing signs and symptoms to look out for infection  and numbers to call if needed.  Reviewed discharge medications with the patient and gave him his discharge instruction packet.  Patient was in stable condition and discharged home with gilfriend

## 2016-04-20 ENCOUNTER — Other Ambulatory Visit: Payer: Self-pay

## 2016-04-23 ENCOUNTER — Ambulatory Visit (INDEPENDENT_AMBULATORY_CARE_PROVIDER_SITE_OTHER): Payer: TRICARE For Life (TFL) | Admitting: Surgery

## 2016-04-23 ENCOUNTER — Encounter: Payer: Self-pay | Admitting: Surgery

## 2016-04-23 VITALS — BP 166/82 | HR 77 | Temp 98.5°F | Ht 73.0 in | Wt 209.2 lb

## 2016-04-23 DIAGNOSIS — Z09 Encounter for follow-up examination after completed treatment for conditions other than malignant neoplasm: Secondary | ICD-10-CM

## 2016-04-23 NOTE — Progress Notes (Signed)
S/p lap appy by Dr. Michela PitcherEly Doing well No complaints, taking PO having Bm, no fevers  PE NAD Abd: soft, NT, stitches removed, wounds healing well , no infection  A/P Doing well Path d/w pt No heavy lifting for 6 weeks from time of surgery RTC prn

## 2016-05-02 NOTE — Discharge Summary (Signed)
Patient ID: Florian BuffStephen Wade Carr MRN: [id number] DOB/AGE: Nov 06, 1965 50 y.o.  Admit date: 04/14/2016 Discharge date: 05/02/2016  Discharge Diagnoses:  Acute appendicitis  Procedures Performed: Laparoscopic appendectomy  Discharged Condition: good  Hospital Course: He was admitted with signs and symptoms consistent with acute appendicitis. Clinical presentation and laboratory information and imaging studies confirm the diagnosis. He was taken urgently to surgery where he underwent laparoscopic appendectomy. The procedure was uncomplicated. He had no significant intraoperative or initial postoperative problems. He was discharged home on a regular diet. Bathing activity in drive instructions were given the patient.  Discharge Orders: Discharge Instructions    Diet - low sodium heart healthy    Complete by:  As directed    Driving Restrictions    Complete by:  As directed    You may drive when no longer taking pain medicine   Increase activity slowly    Complete by:  As directed    Lifting restrictions    Complete by:  As directed    Do not lift anything heavier than your dinner plate   No dressing needed    Complete by:  As directed       Disposition: 01-Home or Self Care  Discharge Medications: No current facility-administered medications for this encounter.   Current Outpatient Prescriptions:  .  aspirin 81 MG chewable tablet, Chew 1 tablet by mouth daily., Disp: , Rfl:  .  levothyroxine (SYNTHROID, LEVOTHROID) 175 MCG tablet, Take 1 tablet by mouth daily., Disp: , Rfl:  .  pantoprazole (PROTONIX) 20 MG tablet, Take 20 mg by mouth daily., Disp: , Rfl:  .  simvastatin (ZOCOR) 20 MG tablet, Take 1 tablet by mouth daily., Disp: , Rfl:  .  traZODone (DESYREL) 100 MG tablet, Take 1 tablet by mouth at bedtime. , Disp: , Rfl:  .  albuterol (PROVENTIL HFA;VENTOLIN HFA) 108 (90 Base) MCG/ACT inhaler, Inhale 2 puffs into the lungs as needed., Disp: , Rfl:  .  cetirizine (ZYRTEC)  10 MG tablet, Take 1 tablet by mouth 1 day or 1 dose., Disp: , Rfl:  .  divalproex (DEPAKOTE) 250 MG DR tablet, Take 1 tablet by mouth 1 day or 1 dose., Disp: , Rfl:  .  fluticasone (FLONASE) 50 MCG/ACT nasal spray, Place 2 sprays into the nose 1 day or 1 dose., Disp: , Rfl:  .  HYDROcodone-acetaminophen (NORCO/VICODIN) 5-325 MG tablet, Take 1-2 tablets by mouth every 4 (four) hours as needed for moderate pain., Disp: 30 tablet, Rfl: 0 .  mometasone (ASMANEX) 220 MCG/INH inhaler, Inhale 2 puffs into the lungs 1 day or 1 dose., Disp: , Rfl:  .  naproxen (NAPROSYN) 500 MG tablet, Take 1 tablet by mouth 2 (two) times daily with a meal., Disp: , Rfl:  .  omeprazole (PRILOSEC) 20 MG capsule, Take 1 capsule by mouth 1 day or 1 dose., Disp: , Rfl:   Follwup: Follow-up Information    Leafy Roiego F Pabon, MD. Go on 04/23/2016.   Specialty:  General Surgery Why:  @11am  Contact information: [address] [place] [id number] [phone]           Signed: Tiney RougeRalph Ely III 05/02/2016, 10:08 AM

## 2018-01-26 IMAGING — CT CT ABD-PELV W/ CM
2 of 5 series · 15 of 46 positions shown, 17 images · IV contrast (APPLIED)
Comparison: None.

CLINICAL DATA: Weakness, abdominal pain

EXAM:
CT ABDOMEN AND PELVIS WITH CONTRAST
TECHNIQUE: Multidetector CT imaging of the abdomen and pelvis was performed
using the standard protocol following bolus administration of
intravenous contrast.
CONTRAST:  100 mL Isovue 300 IV

[Series 2: axial st · axial · 0.74mm/px · z∈[+466,+966]mm · 12 of 112 slices shown, 14 images]
[im 6/112  soft-tissue]
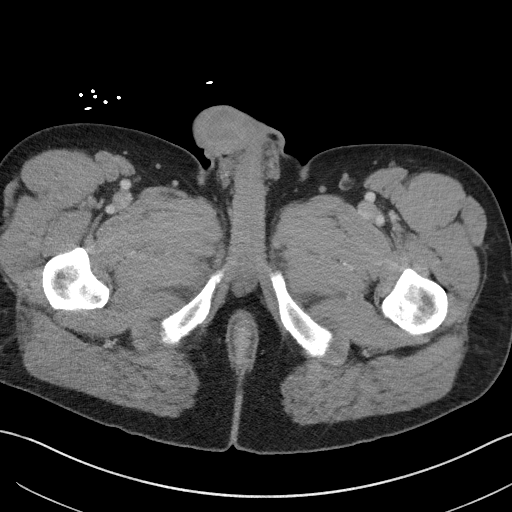
[im 6/112  bone]
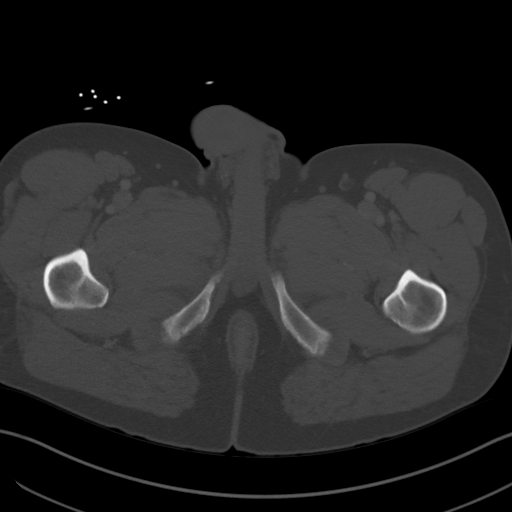
[im 16/112  soft-tissue]
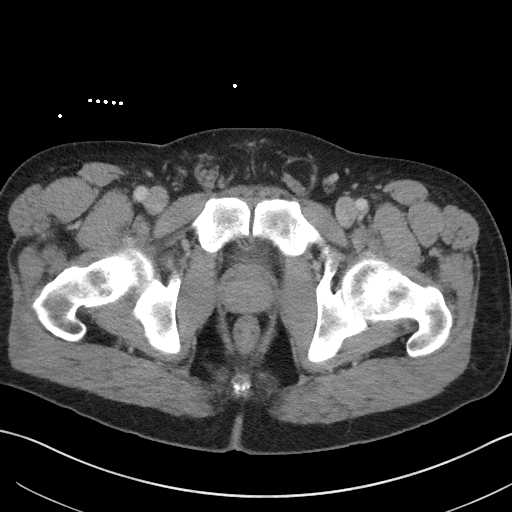
[im 27/112  soft-tissue]
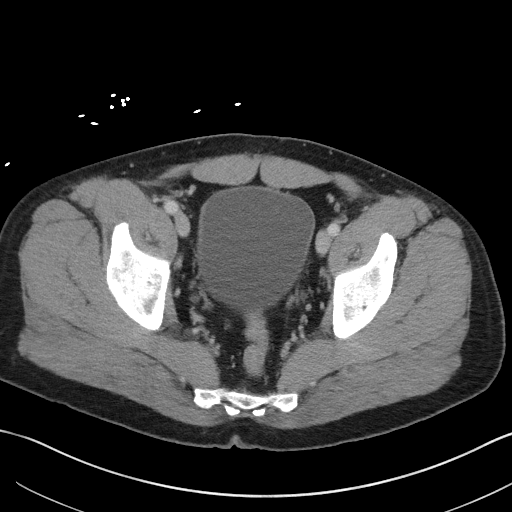
[im 32/112  soft-tissue]
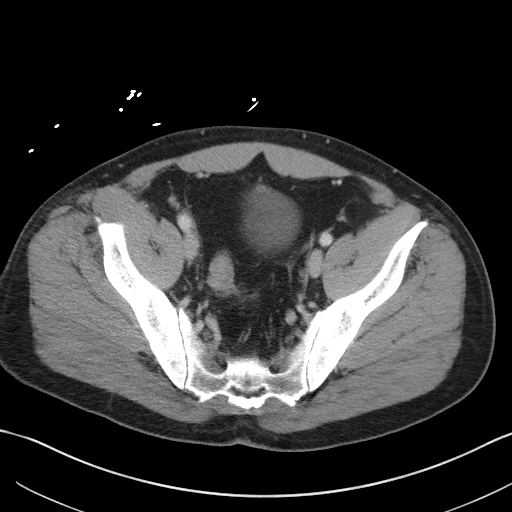
[im 43/112  soft-tissue]
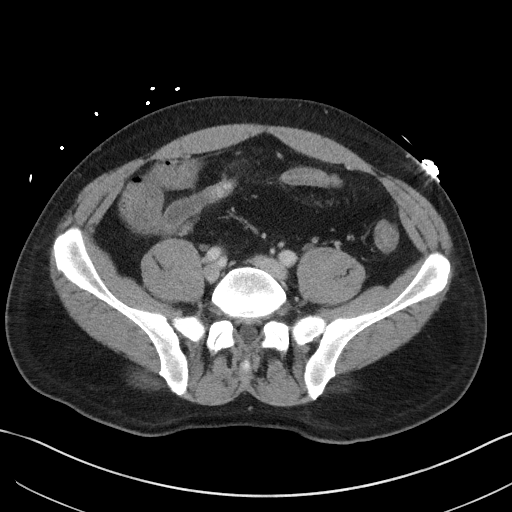
[im 53/112  soft-tissue]
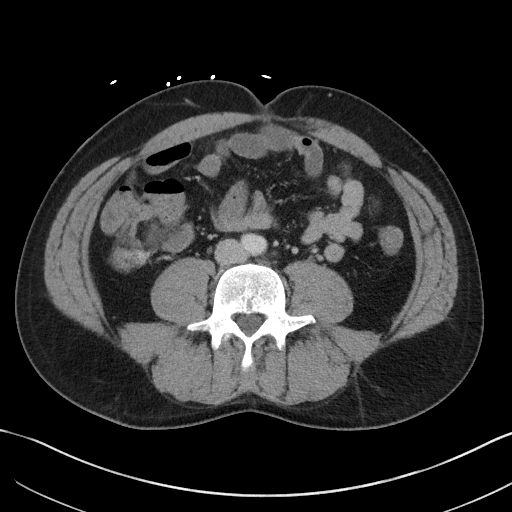
[im 59/112  soft-tissue]
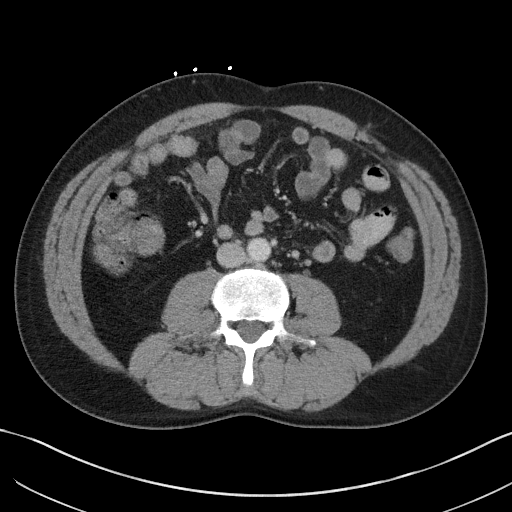
[im 69/112  soft-tissue]
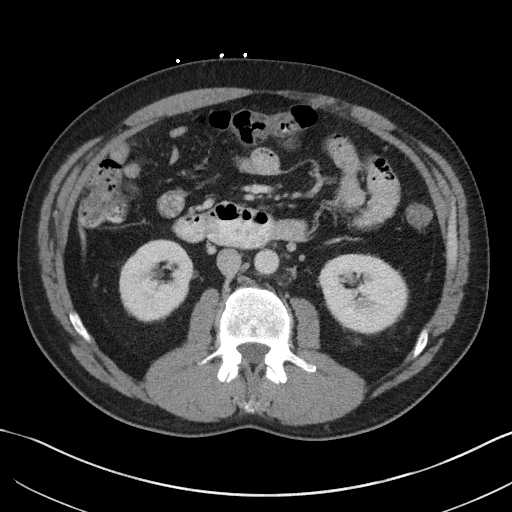
[im 80/112  soft-tissue]
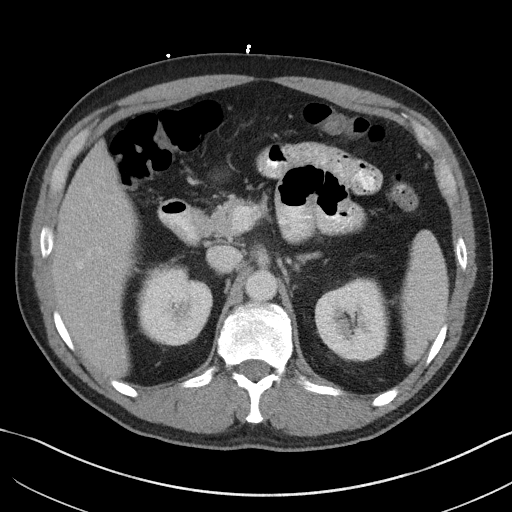
[im 80/112  bone]
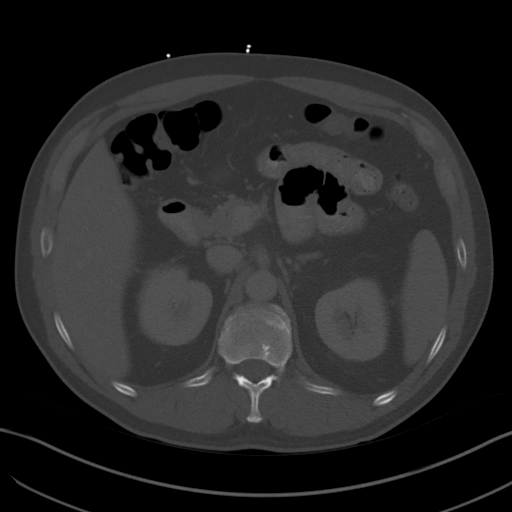
[im 85/112  soft-tissue]
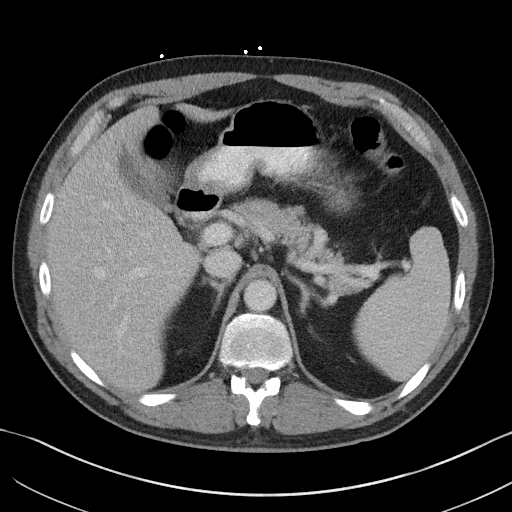
[im 96/112  soft-tissue]
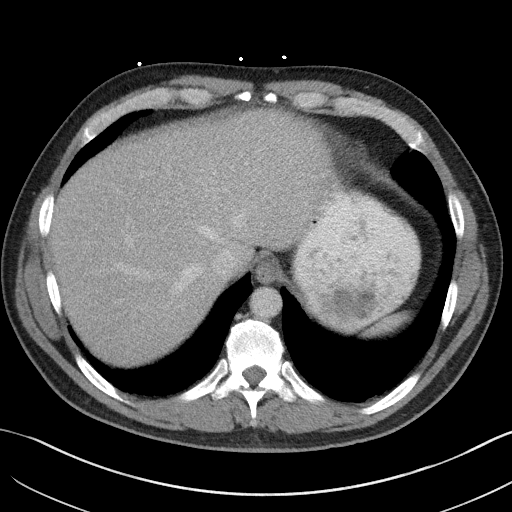
[im 106/112  soft-tissue]
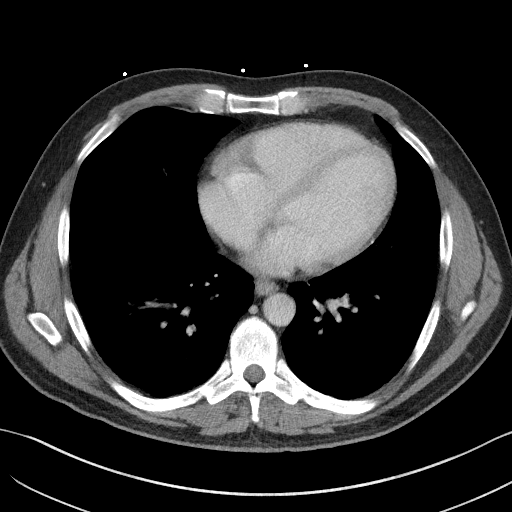

[Series 5: coronal st · coronal · 0.70mm/px · 3 of 96 slices shown]
[im 32/96  soft-tissue]
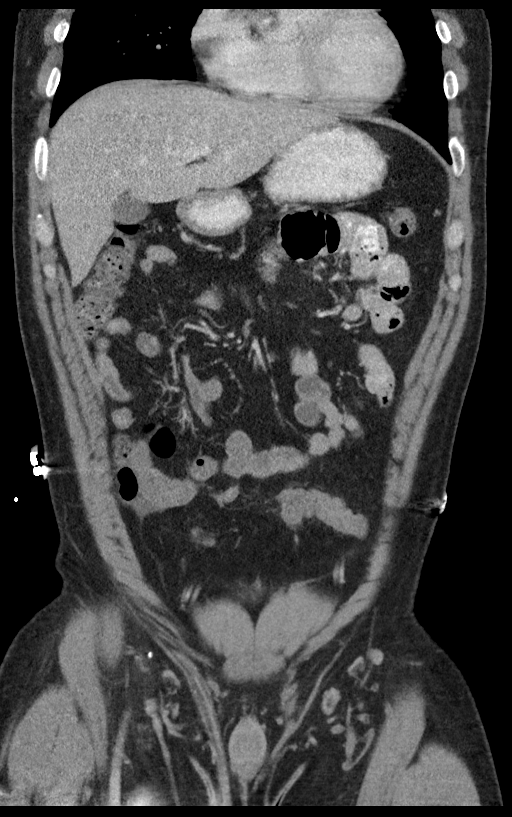
[im 43/96  soft-tissue]
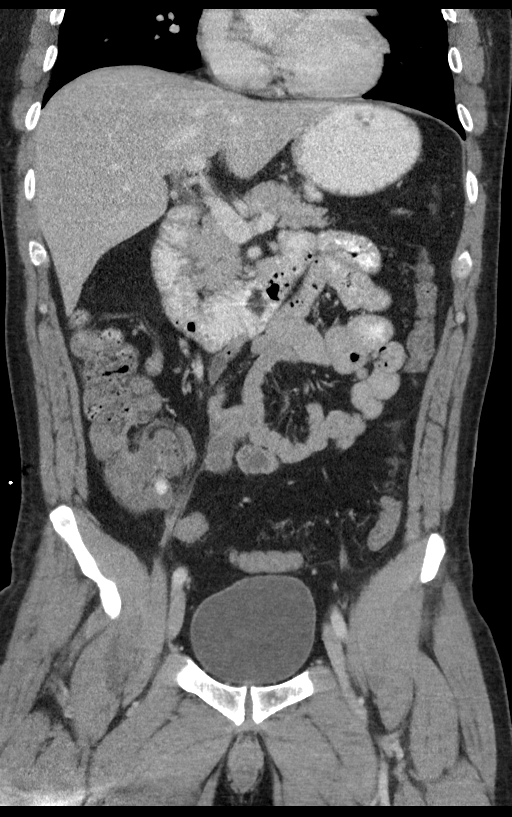
[im 53/96  soft-tissue]
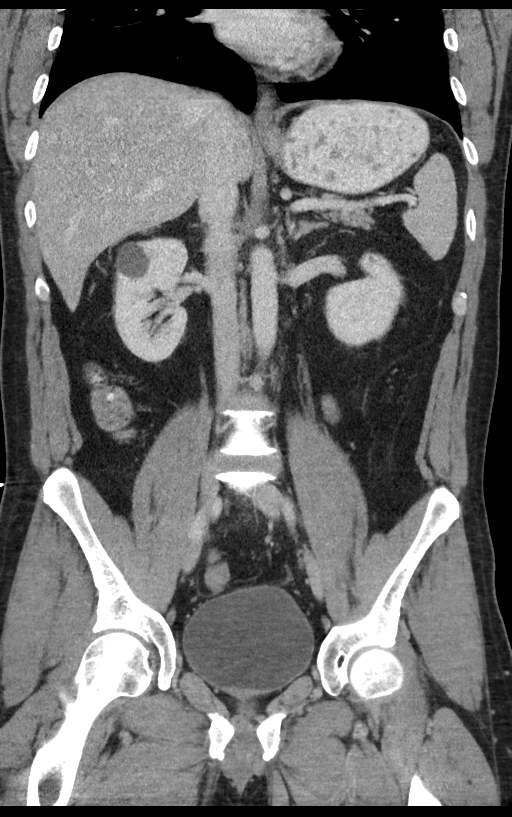

[15 of 46 positions shown; findings below may reference images not displayed]

FINDINGS: Lower chest: Lung bases are essentially clear.

Hepatobiliary: Liver is within normal limits.

Gallbladder is unremarkable. No intrahepatic or extrahepatic ductal
dilatation.

Pancreas: Within normal limits.

Spleen: Within normal limits.

Adrenals/Urinary Tract: Adrenal glands are within normal limits.

2.7 cm right upper pole renal cyst (series 2/ image 36). 12 mm
septated cyst in the interpolar right kidney (series 2/image 38),
indeterminate. 7 mm cyst in the anterior left upper kidney (series
2/image 36.

No hydronephrosis.

Bladder is within normal limits.

Stomach/Bowel: Stomach is within normal limits.

No evidence of bowel obstruction.

Dilatation of the proximal appendix measuring up to 16 mm (series
2/image 70). Mild periappendiceal inflammatory changes (series 2/
image 68). Associated proximal appendicolith (series 2/image 68).
These findings are compatible with acute appendicitis.

No drainable fluid collection/abscess.  No free air.

Vascular/Lymphatic: No evidence of abdominal aortic aneurysm.

No suspicious abdominopelvic lymphadenopathy.

Reproductive: Prostate is unremarkable.

Other: No abdominopelvic ascites.

Small fat containing left inguinal hernia (series 2/image 97).

Musculoskeletal: Mild degenerative changes of the visualized
thoracolumbar spine.
IMPRESSION: Acute appendicitis.

No drainable fluid collection/ abscess.  No free air.
